# Patient Record
Sex: Female | Born: 1980 | Race: Black or African American | Hispanic: No | Marital: Single | Smoking: Current some day smoker
Health system: Southern US, Community
[De-identification: ages and names within clinical notes are randomized; demographics above are authoritative.]

## PROBLEM LIST (undated history)

## (undated) DIAGNOSIS — K5790 Diverticulosis of intestine, part unspecified, without perforation or abscess without bleeding: Secondary | ICD-10-CM

## (undated) DIAGNOSIS — F99 Mental disorder, not otherwise specified: Secondary | ICD-10-CM

## (undated) DIAGNOSIS — K589 Irritable bowel syndrome without diarrhea: Secondary | ICD-10-CM

## (undated) HISTORY — DX: Diverticulosis of intestine, part unspecified, without perforation or abscess without bleeding: K57.90

## (undated) HISTORY — DX: Irritable bowel syndrome, unspecified: K58.9

## (undated) HISTORY — PX: OTHER SURGICAL HISTORY: SHX169

## (undated) HISTORY — DX: Mental disorder, not otherwise specified: F99

---

## 2006-06-09 HISTORY — PX: CHOLECYSTECTOMY: SHX55

## 2006-08-13 ENCOUNTER — Emergency Department: Payer: Self-pay | Admitting: Emergency Medicine

## 2006-09-04 ENCOUNTER — Ambulatory Visit: Payer: Self-pay | Admitting: General Surgery

## 2020-01-25 ENCOUNTER — Other Ambulatory Visit: Payer: Self-pay

## 2020-01-25 ENCOUNTER — Ambulatory Visit (LOCAL_COMMUNITY_HEALTH_CENTER): Payer: Self-pay | Admitting: Advanced Practice Midwife

## 2020-01-25 ENCOUNTER — Encounter: Payer: Self-pay | Admitting: Advanced Practice Midwife

## 2020-01-25 VITALS — BP 130/82 | Ht 61.0 in | Wt 128.4 lb

## 2020-01-25 DIAGNOSIS — Z3009 Encounter for other general counseling and advice on contraception: Secondary | ICD-10-CM

## 2020-01-25 DIAGNOSIS — F1090 Alcohol use, unspecified, uncomplicated: Secondary | ICD-10-CM

## 2020-01-25 DIAGNOSIS — K580 Irritable bowel syndrome with diarrhea: Secondary | ICD-10-CM

## 2020-01-25 DIAGNOSIS — N760 Acute vaginitis: Secondary | ICD-10-CM

## 2020-01-25 DIAGNOSIS — K589 Irritable bowel syndrome without diarrhea: Secondary | ICD-10-CM | POA: Insufficient documentation

## 2020-01-25 DIAGNOSIS — Z7289 Other problems related to lifestyle: Secondary | ICD-10-CM

## 2020-01-25 DIAGNOSIS — Z789 Other specified health status: Secondary | ICD-10-CM

## 2020-01-25 DIAGNOSIS — B9689 Other specified bacterial agents as the cause of diseases classified elsewhere: Secondary | ICD-10-CM

## 2020-01-25 DIAGNOSIS — Z3046 Encounter for surveillance of implantable subdermal contraceptive: Secondary | ICD-10-CM

## 2020-01-25 LAB — WET PREP FOR TRICH, YEAST, CLUE
Trichomonas Exam: NEGATIVE
Yeast Exam: NEGATIVE

## 2020-01-25 NOTE — Progress Notes (Addendum)
Here today for PE. No PE or Pap records in Epic or [facility] systems. Has Nexplanon for current birth control. Per patient due to be changed 03/2021. Wants all STD screening today. PHQ 9 completed. Tawny Hopping, RN

## 2020-01-25 NOTE — Progress Notes (Signed)
[facility] DEPARTMENT [facility] [address] Main Number: [phone]    Family Planning Visit- Initial Visit  Subjective:  Bethany Stephens is a 39 y.o. engaged BF [phone] smoker and vaper   being seen today for an initial well woman visit and to discuss family planning options.  She is currently using Nexplanon for pregnancy prevention. Patient reports she does not want a pregnancy in the next year.  Patient has the following medical conditions does not have a problem list on file. IBS.  Pt with excessive eye makeup on, rapid speech, good eye contact  Chief Complaint  Patient presents with   Contraception    Physical Exam    Patient reports c/o increased white d/c with malodor x 1 week.  Fiance has been unfaithful.  Last cig 1 wek ago.  Last vaped 01/22/20.  Last MJ age 29.  Last ecstacy age 36.  Last ETOH last night (2 Coronas) daily.  LMP 12/2019?Marland Kitchen  Last sex 01/20/20 without condom.  Has Nexplanon placed 03/2018.  Living with her 2 daughters (53, 24) and just bought a house.  Her 36 yo son was shot and murdered in [place] 11/25/18 in a drive by and she hasn't received counseling.  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  PHQ-9=7 and pt states is having difficulty coping. Denies any formal psych dx, denies SI/HI or hx of.  Hx schizophrenia in m. Aunt and m. Uncle. Thinks last pap 2019?  Patient denies current drug use  Body mass index is 24.26 kg/m. - Patient is eligible for diabetes screening based on BMI and age >79?  not applicable HA1C ordered? not applicable  Patient reports 1 of partners in last year. Desires STI screening?  Yes  Has patient been screened once for HCV in the past?  No  No results found for: HCVAB  Does the patient have current drug use (including MJ), have a partner with drug use, and/or has been incarcerated since last result? No  If yes-- Screen  for HCV through [facility]   Does the patient meet criteria for HBV testing? No  Criteria:  -Household, sexual or needle sharing contact with HBV -History of drug use -HIV positive -Those with known Hep C   Health Maintenance Due  Topic Date Due   Hepatitis C Screening  Never done   COVID-19 Vaccine (1) Never done   HIV Screening  Never done   PAP SMEAR-Modifier  Never done   INFLUENZA VACCINE  01/08/2020    Review of Systems  Gastrointestinal: Positive for diarrhea (has IBS and diarrhea increasing).  All other systems reviewed and are negative.   The following portions of the patient's history were reviewed and updated as appropriate: allergies, current medications, past family history, past medical history, past social history, past surgical history and problem list. Problem list updated.   See flowsheet for other program required questions.  Objective:   Vitals:   01/25/20 1330  BP: 130/82  Weight: 128 lb 6.4 oz (58.2 kg)  Height: 5\' 1"  (1.549 m)    Physical Exam Constitutional:      Appearance: Normal appearance. She is normal weight.  HENT:     Head: Normocephalic and atraumatic.     Mouth/Throat:     Mouth: Mucous membranes are moist.  Eyes:     Conjunctiva/sclera: Conjunctivae normal.  Cardiovascular:  Rate and Rhythm: Normal rate and regular rhythm.  Pulmonary:     Effort: Pulmonary effort is normal.     Breath sounds: Normal breath sounds.  Chest:     Breasts:        Right: Normal.        Left: Normal.  Abdominal:     Palpations: Abdomen is soft.     Comments: Soft without masses or tenderness, good tone  Genitourinary:    General: Normal vulva.     Exam position: Lithotomy position.     Vagina: Vaginal discharge (grey creamy leukorrhea with malodor, ph>4.5) present.     Cervix: Normal.     Uterus: Normal.      Adnexa: Right adnexa normal and left adnexa normal.     Rectum: Normal.     Comments: Pap done Musculoskeletal:         General: Normal range of motion.     Cervical back: Normal range of motion and neck supple.  Skin:    General: Skin is warm and dry.  Neurological:     Mental Status: She is alert.  Psychiatric:        Mood and Affect: Mood normal.       Assessment and Plan:  [name] is a 39 y.o. female presenting to the [facility] Department for an initial well woman exam/family planning visit  Contraception counseling: Reviewed all forms of birth control options in the tiered based approach. available including abstinence; over the counter/barrier methods; hormonal contraceptive medication including pill, patch, ring, injection,contraceptive implant, ECP; hormonal and nonhormonal IUDs; permanent sterilization options including vasectomy and the various tubal sterilization modalities. Risks, benefits, and typical effectiveness rates were reviewed.  Questions were answered.  Written information was also given to the patient to review.  Patient desires continuation of Nexplanon, this was prescribed for patient. She will follow up in 1 year for surveillance.  She was told to call with any further questions, or with any concerns about this method of contraception.  Emphasized use of condoms 100% of the time for STI prevention.  Patient was offered ECP. ECP was not accepted by the patient. ECP counseling was not given - see RN documentation  1. Family planning Treat wet mount per standing orders Immunization nurse consult Please give pt Bethany Stephens contact info Please give pt primary care MD list Counsel on need for mammogram and colonoscopy due to IBS and family hx stage 4 colon cancer in m. aunt  - WET PREP FOR TRICH, YEAST, CLUE - Syphilis Serology, [facility] - HIV [facility] - Chlamydia/Gonorrhea [facility] - IGP, [facility]ptima HPV - Ambulatory referral to Behavioral Health  2. Encounter for surveillance of implantable subdermal contraceptive Due for removal 03/2021     No  follow-ups on file.  No future appointments.  Alberteen Spindle, CNM

## 2020-01-26 MED ORDER — METRONIDAZOLE 500 MG PO TABS: 500.0000 mg | ORAL_TABLET | Freq: Two times a day (BID) | ORAL | 0 refills | Status: AC

## 2020-01-26 NOTE — Addendum Note (Signed)
Addended by: Tracey Harries on: 01/26/2020 02:53 PM   Modules accepted: Orders

## 2020-01-26 NOTE — Progress Notes (Signed)
Wet mount reviewed by provider; per verbal order by Elizabeth Sciora, CNM, pt treated for BV per standing order. Provider orders completed. 

## 2020-01-30 LAB — IGP, APTIMA HPV
HPV Aptima: NEGATIVE
PAP Smear Comment: 0

## 2020-02-01 ENCOUNTER — Telehealth: Payer: Self-pay | Admitting: Family Medicine

## 2020-02-01 NOTE — Telephone Encounter (Signed)
Wants test results

## 2020-02-02 NOTE — Telephone Encounter (Signed)
Phone call to pt. Pt states she was seen on the 18th last week and wants TR. Pt was not able to verify a password from her visit. Pt declined TR appt. Pt counseled that it could take up to 3 weeks to get all TR, we do not call with negative results, we will call if anything is positive or if there is a problem with testing. Sent My Chart information for sign up to cell phone on file (and confirmed by pt) per pt request.

## 2020-08-17 ENCOUNTER — Emergency Department: Payer: Self-pay

## 2020-08-17 ENCOUNTER — Emergency Department
Admission: EM | Admit: 2020-08-17 | Discharge: 2020-08-17 | Disposition: A | Discharge status: Home / Self Care | Payer: Self-pay | Attending: Emergency Medicine | Admitting: Emergency Medicine

## 2020-08-17 ENCOUNTER — Other Ambulatory Visit: Payer: Self-pay

## 2020-08-17 DIAGNOSIS — R1030 Lower abdominal pain, unspecified: Secondary | ICD-10-CM | POA: Insufficient documentation

## 2020-08-17 DIAGNOSIS — R300 Dysuria: Secondary | ICD-10-CM | POA: Insufficient documentation

## 2020-08-17 DIAGNOSIS — F17210 Nicotine dependence, cigarettes, uncomplicated: Secondary | ICD-10-CM | POA: Insufficient documentation

## 2020-08-17 DIAGNOSIS — F17290 Nicotine dependence, other tobacco product, uncomplicated: Secondary | ICD-10-CM | POA: Insufficient documentation

## 2020-08-17 DIAGNOSIS — R339 Retention of urine, unspecified: Secondary | ICD-10-CM | POA: Insufficient documentation

## 2020-08-17 LAB — CBC WITH DIFFERENTIAL/PLATELET
Abs Immature Granulocytes: 0.36 10*3/uL — ABNORMAL HIGH (ref 0.00–0.07)
Basophils Absolute: 0.1 10*3/uL (ref 0.0–0.1)
Basophils Relative: 0 %
Eosinophils Absolute: 0 10*3/uL (ref 0.0–0.5)
Eosinophils Relative: 0 %
HCT: 30.9 % — ABNORMAL LOW (ref 36.0–46.0)
Hemoglobin: 9.9 g/dL — ABNORMAL LOW (ref 12.0–15.0)
Immature Granulocytes: 2 %
Lymphocytes Relative: 17 %
Lymphs Abs: 2.8 10*3/uL (ref 0.7–4.0)
MCH: 29.9 pg (ref 26.0–34.0)
MCHC: 32 g/dL (ref 30.0–36.0)
MCV: 93.4 fL (ref 80.0–100.0)
Monocytes Absolute: 1.1 10*3/uL — ABNORMAL HIGH (ref 0.1–1.0)
Monocytes Relative: 6 %
Neutro Abs: 12.5 10*3/uL — ABNORMAL HIGH (ref 1.7–7.7)
Neutrophils Relative %: 75 %
Platelets: 518 10*3/uL — ABNORMAL HIGH (ref 150–400)
RBC: 3.31 MIL/uL — ABNORMAL LOW (ref 3.87–5.11)
RDW: 14.2 % (ref 11.5–15.5)
WBC: 16.8 10*3/uL — ABNORMAL HIGH (ref 4.0–10.5)
nRBC: 0.2 % (ref 0.0–0.2)

## 2020-08-17 LAB — POC URINE PREG, ED: Preg Test, Ur: NEGATIVE

## 2020-08-17 LAB — URINALYSIS, COMPLETE (UACMP) WITH MICROSCOPIC
Bacteria, UA: NONE SEEN
Bilirubin Urine: NEGATIVE
Glucose, UA: NEGATIVE mg/dL
Ketones, ur: NEGATIVE mg/dL
Leukocytes,Ua: NEGATIVE
Nitrite: NEGATIVE
Protein, ur: NEGATIVE mg/dL
Specific Gravity, Urine: 1.005 (ref 1.005–1.030)
Squamous Epithelial / HPF: NONE SEEN (ref 0–5)
pH: 6 (ref 5.0–8.0)

## 2020-08-17 LAB — COMPREHENSIVE METABOLIC PANEL
ALT: 24 U/L (ref 0–44)
AST: 27 U/L (ref 15–41)
Albumin: 3.5 g/dL (ref 3.5–5.0)
Alkaline Phosphatase: 35 U/L — ABNORMAL LOW (ref 38–126)
Anion gap: 15 (ref 5–15)
BUN: 9 mg/dL (ref 6–20)
CO2: 18 mmol/L — ABNORMAL LOW (ref 22–32)
Calcium: 8.6 mg/dL — ABNORMAL LOW (ref 8.9–10.3)
Chloride: 104 mmol/L (ref 98–111)
Creatinine, Ser: 0.56 mg/dL (ref 0.44–1.00)
GFR, Estimated: >60 mL/min (ref >60)
Glucose, Bld: 87 mg/dL (ref 70–99)
Potassium: 3.9 mmol/L (ref 3.5–5.1)
Sodium: 137 mmol/L (ref 135–145)
Total Bilirubin: 0.9 mg/dL (ref 0.3–1.2)
Total Protein: 7.1 g/dL (ref 6.5–8.1)

## 2020-08-17 LAB — PREGNANCY, URINE: Preg Test, Ur: NEGATIVE

## 2020-08-17 IMAGING — CT CT ABD-PELV W/ CM
2 of 5 series · 15 of 46 positions shown, 17 images · IV contrast (APPLIED)
Comparison: None.

CLINICAL DATA: Urinary retention status post liposuction and
gluteal lift.

EXAM:
CT ABDOMEN AND PELVIS WITH CONTRAST
TECHNIQUE: Multidetector CT imaging of the abdomen and pelvis was performed
using the standard protocol following bolus administration of
intravenous contrast.
CONTRAST:  100mL OMNIPAQUE IOHEXOL 300 MG/ML  SOLN

[Series 2: routine abd/pel with · axial · 0.68mm/px · z∈[-676,-306]mm · 12 of 84 slices shown, 14 images]
[im 5/84  soft-tissue]
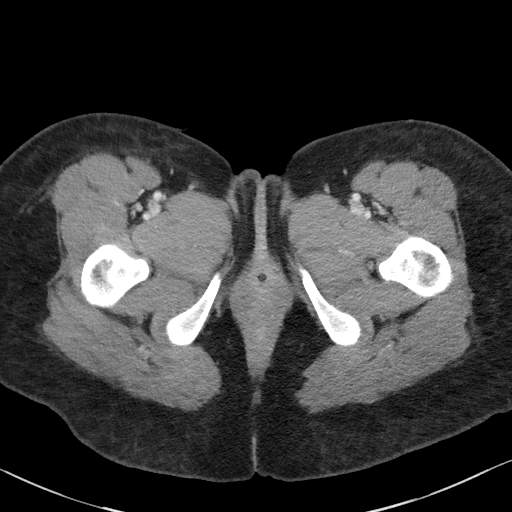
[im 5/84  bone]
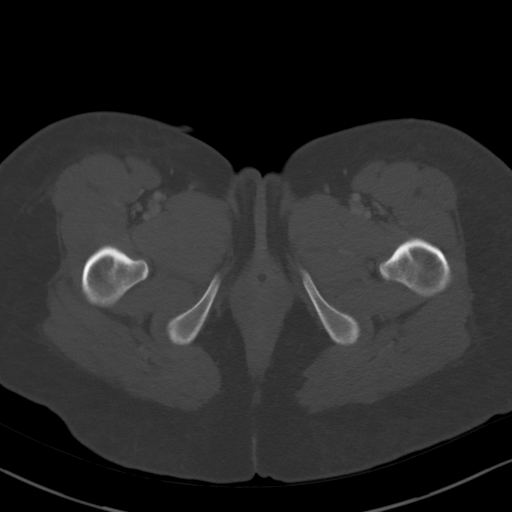
[im 14/84  soft-tissue]
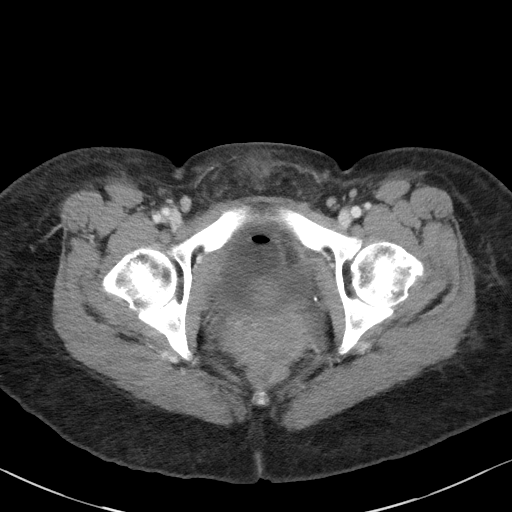
[im 19/84  soft-tissue]
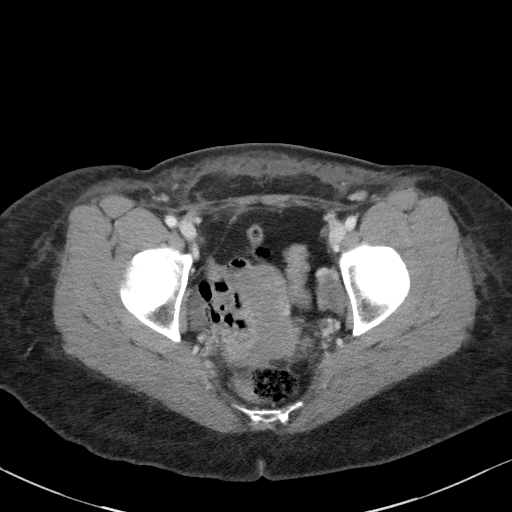
[im 24/84  soft-tissue]
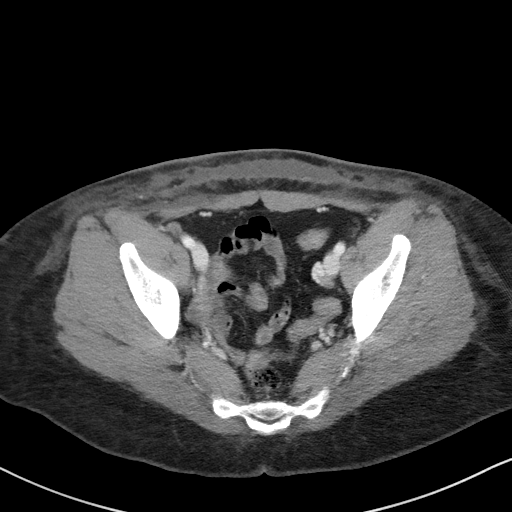
[im 33/84  soft-tissue]
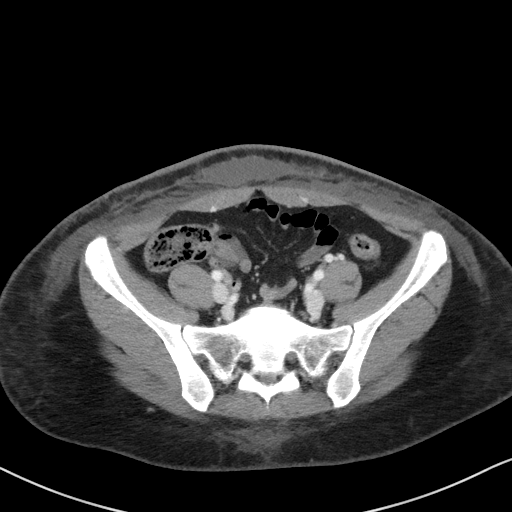
[im 37/84  soft-tissue]
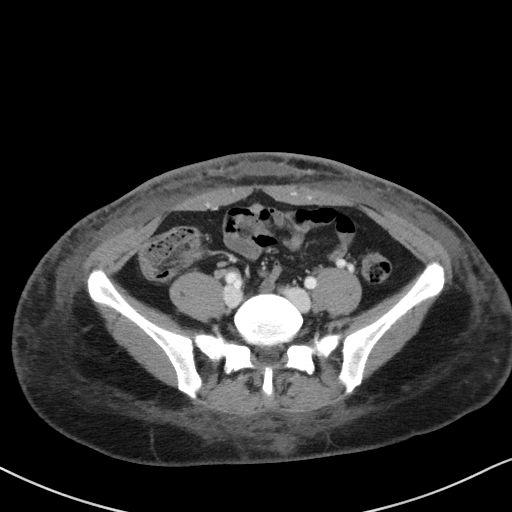
[im 47/84  soft-tissue]
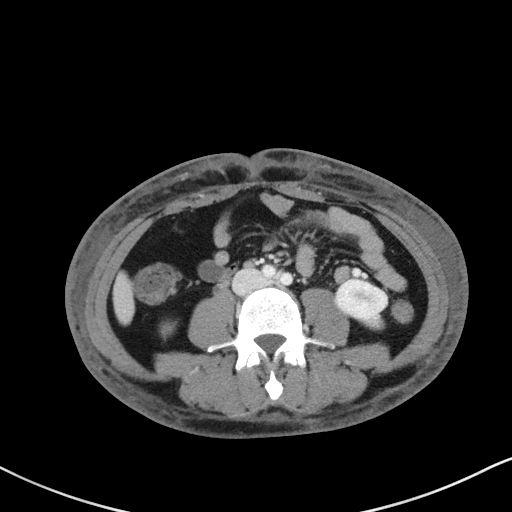
[im 51/84  soft-tissue]
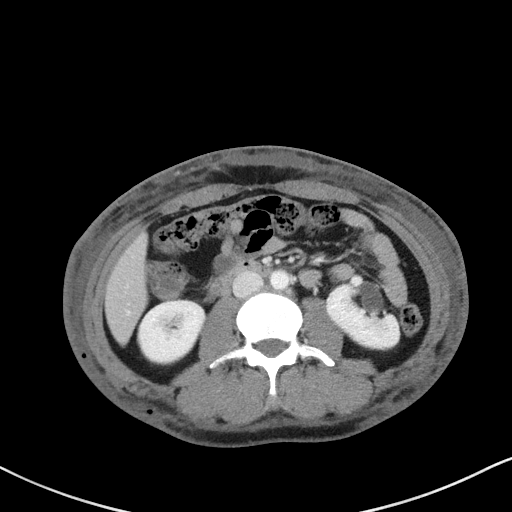
[im 60/84  soft-tissue]
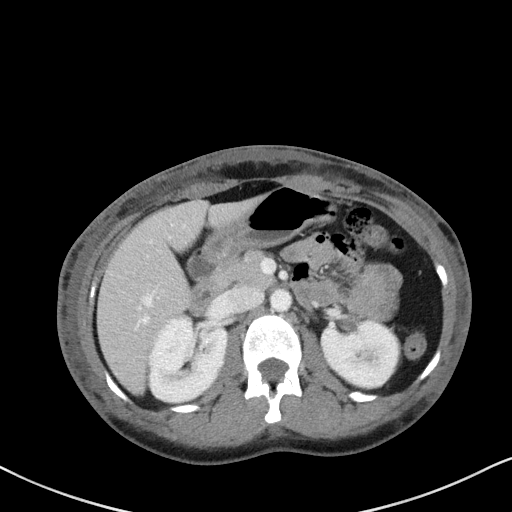
[im 60/84  bone]
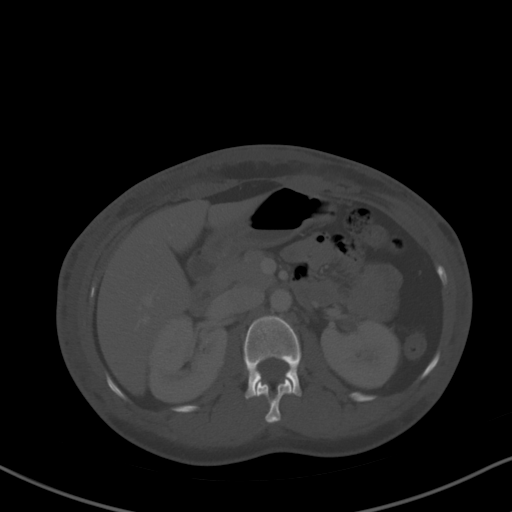
[im 65/84  soft-tissue]
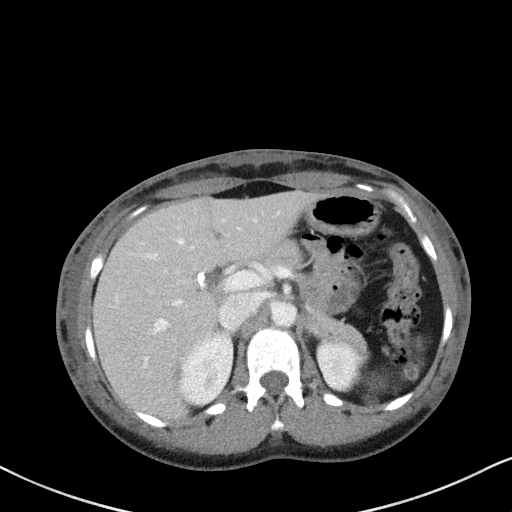
[im 70/84  soft-tissue]
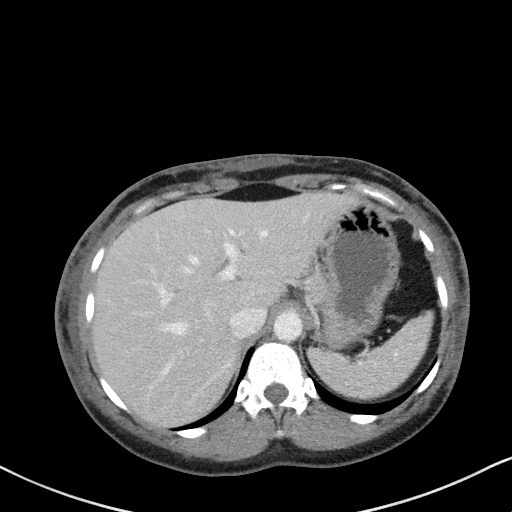
[im 79/84  soft-tissue]
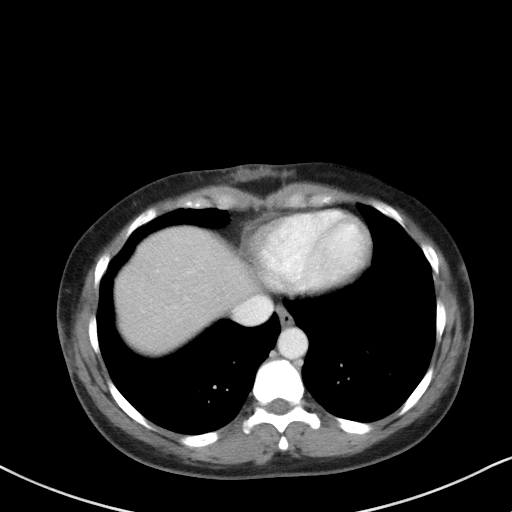

[Series 5: coronal st · coronal · 0.64mm/px · 3 of 88 slices shown]
[im 30/88  soft-tissue]
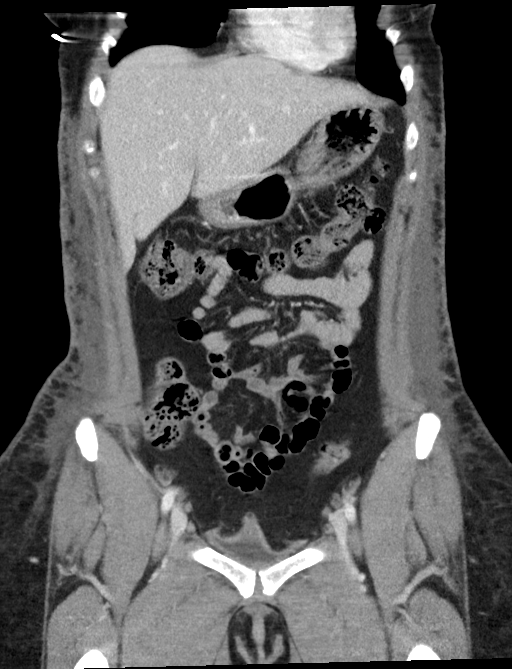
[im 39/88  soft-tissue]
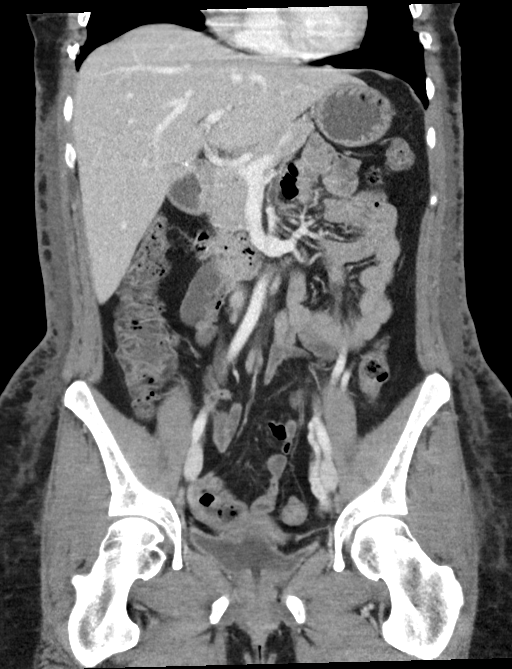
[im 49/88  soft-tissue]
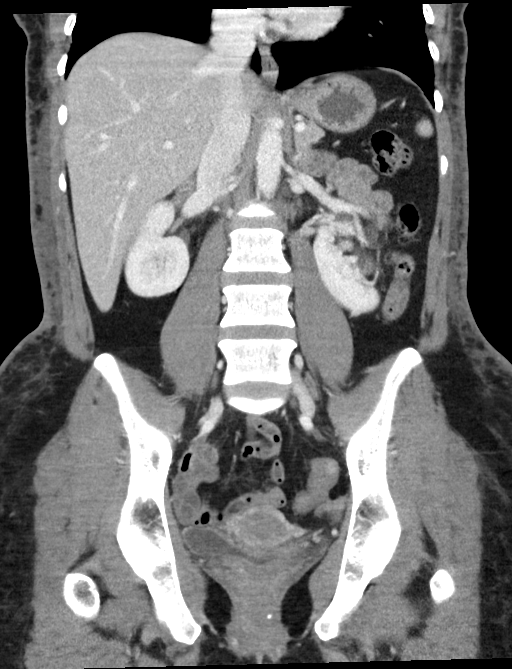

[15 of 46 positions shown; findings below may reference images not displayed]

FINDINGS: Lower chest: The lung bases are clear. The heart size is normal.

Hepatobiliary: The liver is normal. Status post
cholecystectomy.There is no biliary ductal dilation.

Pancreas: Normal contours without ductal dilatation. No
peripancreatic fluid collection.

Spleen: There are tiny benign-appearing cystic nodules involving the
spleen.

Adrenals/Urinary Tract:

--Adrenal glands: Unremarkable.

--Right kidney/ureter: No hydronephrosis or radiopaque kidney
stones.

--Left kidney/ureter: Left kidney is malrotated without evidence for
hydronephrosis.

--Urinary bladder: The urinary bladder is decompressed by Foley
catheter.

Stomach/Bowel:

--Stomach/Duodenum: No hiatal hernia or other gastric abnormality.
Normal duodenal course and caliber.

--Small bowel: Unremarkable.

--Colon: Unremarkable.

--Appendix: Normal.

Vascular/Lymphatic: Normal course and caliber of the major abdominal
vessels.

--No retroperitoneal lymphadenopathy.

--No mesenteric lymphadenopathy.

--No pelvic or inguinal lymphadenopathy.

Reproductive: Unremarkable

Other: There is extensive subcutaneous fat stranding and fluid
tracking through the patient's anterior abdominal wall. There is
overlying skin thickening. Findings are felt to be secondary to the
patient's reported liposuction. The fluid collections do not appear
to be rim enhancing and are favored to represent postoperative
seromas or hematomas. There is fat stranding in the gluteal region
bilaterally favored to be secondary to the reported gluteal lift.
Again, no concerning collection to suggest the presence of an
abscess.

Musculoskeletal. No acute displaced fractures.
IMPRESSION: 1. Extensive subcutaneous fat stranding and fluid tracking through
the patient's anterior abdominal wall. Findings are felt to be
secondary to the patient's reported liposuction. The fluid
collections do not appear to be rim enhancing and are favored to
represent postoperative seromas or hematomas.
2. Fat stranding in the gluteal region bilaterally favored to be
secondary to the patient's reported gluteal lift. Again, no
concerning collection to suggest the presence of an abscess.
3. The urinary bladder is decompressed by Foley catheter.

## 2020-08-17 MED ORDER — IOHEXOL 300 MG/ML  SOLN
100.0000 mL | Freq: Once | INTRAMUSCULAR | Status: AC | PRN
  Administered 2020-08-17: 100 mL via INTRAVENOUS
  Filled 2020-08-17: qty 100

## 2020-08-17 MED ORDER — LACTATED RINGERS IV BOLUS
1000.0000 mL | Freq: Once | INTRAVENOUS | Status: AC
  Administered 2020-08-17: 1000 mL via INTRAVENOUS

## 2020-08-17 MED ORDER — KETOROLAC TROMETHAMINE 30 MG/ML IJ SOLN
30.0000 mg | Freq: Once | INTRAMUSCULAR | Status: AC
  Administered 2020-08-17: 30 mg via INTRAVENOUS
  Filled 2020-08-17: qty 1

## 2020-08-17 MED ORDER — HYDROXYZINE HCL 25 MG PO TABS: 25.0000 mg | ORAL_TABLET | Freq: Four times a day (QID) | ORAL | 0 refills | Status: AC | PRN

## 2020-08-17 MED ORDER — IBUPROFEN 600 MG PO TABS
600.0000 mg | ORAL_TABLET | Freq: Four times a day (QID) | ORAL | 0 refills | Status: DC | PRN
Start: 2020-08-17 — End: 2022-09-19

## 2020-08-17 MED ORDER — SODIUM CHLORIDE 0.9 % IV SOLN
1.0000 g | Freq: Once | INTRAVENOUS | Status: AC
  Administered 2020-08-17: 1 g via INTRAVENOUS
  Filled 2020-08-17: qty 10

## 2020-08-17 MED ORDER — FAMOTIDINE 40 MG PO TABS: 40.0000 mg | ORAL_TABLET | Freq: Every day | ORAL | 0 refills | Status: AC

## 2020-08-17 MED ORDER — DIPHENHYDRAMINE HCL 50 MG/ML IJ SOLN
25.0000 mg | Freq: Once | INTRAMUSCULAR | Status: AC
  Administered 2020-08-17: 25 mg via INTRAVENOUS
  Filled 2020-08-17: qty 1

## 2020-08-17 NOTE — ED Notes (Signed)
~  sterile NS instilled in urinary catheter prior to DC. Foley removed, and patient immediately had urge to void. Attempted to void in bathroom, unsuccessful. MD notified. Patient states she'd like to try again in a few minutes.

## 2020-08-17 NOTE — ED Triage Notes (Addendum)
Pt to ER via [facility] with issues with urination since last night. Pt reports hip/ buttock surgery last week. Starting last night pt has been unable to urinate, reports very little urine output, denies burning or foul odor. Pt in obvious pain in triage. Able to void very little in bathroom.

## 2020-08-17 NOTE — Discharge Instructions (Addendum)
Make sure you have regular bowel movements while taking the pain medication.  You can take over-the-counter stool softener such as Colace or MiraLAX as needed for this.  Drink at least 6 to 8 glasses of water daily.  Try to avoid taking Benadryl for itching.  You can take the prescribed medication.  Only take this as needed, as all of these medications can lead to retention.  It is okay to take ibuprofen.  I prescribed this.  I have also prescribed Pepcid which is an antihistamine that can also be purchased over-the-counter and can help with itching.

## 2020-08-17 NOTE — ED Notes (Signed)
Lab called to add on urine culture to initial UA sent with first cath this afternoon.

## 2020-08-17 NOTE — ED Notes (Signed)
Unable to void second attempt. MD notified.

## 2020-08-17 NOTE — ED Provider Notes (Signed)
[facility] Emergency Department Provider Note  ____________________________________________   Event Date/Time   First MD Initiated Contact with Patient 08/17/20 1305     (approximate)  I have reviewed the triage vital signs and the nursing notes.   HISTORY  Chief Complaint Urinary Retention    HPI Bethany Stephens is a 40 y.o. female here with urinary retention after recent liposuction and gluteal lift.  The patient states that approximately 1.5 weeks ago, she had liposuction and gluteal tuck at a hospital in [place].  She states that she had been recovering fairly well, until the last 24 hours or so.  The patient states that over the last 24 hours, she has felt increasing difficulty urinating.  She is had associated lower abdominal fullness.  She feels like she is been trying to go to the bathroom but has not been able to.  She believes that she has been having bowel movements regularly.  She does admit that she has been taking fairly large levels of Tylenol as well as oxycodone for her pain, and she has had diffuse pruritus related to her analgesia.  She believes that she has been taking Benadryl fairly regularly for this as well.  Denies any fevers.  Denies any drainage from her operative sites.  No other complaints.  No specific alleviating or aggravating factors.        Past Medical History:  Diagnosis Date  . Mental disorder     Patient Active Problem List   Diagnosis Date Noted  . IBS (irritable bowel syndrome) 01/25/2020  . Alcohol use dialy 01/25/2020    Past Surgical History:  Procedure Laterality Date  . CHOLECYSTECTOMY  2008  . hip surgery      Prior to Admission medications   Medication Sig Start Date End Date Taking? Authorizing Provider  famotidine (PEPCID) 40 MG tablet Take 1 tablet (40 mg total) by mouth daily for 7 days. 08/17/20 08/24/20 Yes Shaune Pollack, MD  hydrOXYzine (ATARAX/VISTARIL) 25 MG tablet Take 1 tablet (25 mg total)  by mouth every 6 (six) hours as needed for itching. 08/17/20  Yes Shaune Pollack, MD  ibuprofen (ADVIL) 600 MG tablet Take 1 tablet (600 mg total) by mouth every 6 (six) hours as needed for moderate pain. 08/17/20  Yes Shaune Pollack, MD    Allergies Patient has no known allergies.  Family History  Problem Relation Age of Onset  . Hypertension Maternal Grandmother   . Heart disease Maternal Grandmother   . Diabetes Maternal Grandmother   . Liver cancer Father   . Hypertension Mother   . Diabetes Mother   . Sarcoidosis Cousin   . Kidney disease Maternal Uncle   . Colon cancer Maternal Aunt   . Depression Maternal Aunt   . Schizophrenia Maternal Aunt   . Bipolar disorder Maternal Aunt   . Depression Daughter     Social History Social History   Tobacco Use  . Smoking status: Current Some Day Smoker    Types: Cigarettes, E-cigarettes  . Smokeless tobacco: Current User  . Tobacco comment: Ocassional Vapes  Substance Use Topics  . Alcohol use: Yes    Alcohol/week: 1.0 standard drink    Types: 1 Glasses of wine per week    Comment: daily  . Drug use: Not Currently    Types: Marijuana, MDMA (Ecstacy)    Review of Systems  Review of Systems  Constitutional: Positive for fatigue. Negative for fever.  HENT: Negative for congestion and sore throat.  Eyes: Negative for visual disturbance.  Respiratory: Negative for cough and shortness of breath.   Cardiovascular: Negative for chest pain.  Gastrointestinal: Negative for abdominal pain, diarrhea, nausea and vomiting.  Genitourinary: Positive for decreased urine volume. Negative for flank pain.  Musculoskeletal: Negative for back pain and neck pain.  Skin: Negative for rash and wound.  Neurological: Positive for weakness.  All other systems reviewed and are negative.    ____________________________________________  PHYSICAL EXAM:      VITAL SIGNS: ED Triage Vitals  Enc Vitals Group     BP      Pulse      Resp       Temp      Temp src      SpO2      Weight      Height      Head Circumference      Peak Flow      Pain Score      Pain Loc      Pain Edu?      Excl. in GC?      Physical Exam Vitals and nursing note reviewed.  Constitutional:      General: She is not in acute distress.    Appearance: She is well-developed.  HENT:     Head: Normocephalic and atraumatic.  Eyes:     Conjunctiva/sclera: Conjunctivae normal.  Cardiovascular:     Rate and Rhythm: Normal rate and regular rhythm.     Heart sounds: Normal heart sounds.  Pulmonary:     Effort: Pulmonary effort is normal. No respiratory distress.     Breath sounds: No wheezing.  Abdominal:     General: Abdomen is flat. There is no distension.     Tenderness: There is abdominal tenderness (Moderate suprapubic tenderness). There is no guarding or rebound.  Musculoskeletal:     Cervical back: Neck supple.  Skin:    General: Skin is warm.     Capillary Refill: Capillary refill takes less than 2 seconds.     Findings: No rash.  Neurological:     Mental Status: She is alert and oriented to person, place, and time.     Motor: No abnormal muscle tone.       ____________________________________________   LABS (all labs ordered are listed, but only abnormal results are displayed)  Labs Reviewed  URINALYSIS, COMPLETE (UACMP) WITH MICROSCOPIC - Abnormal; Notable for the following components:      Result Value   Color, Urine STRAW (*)    APPearance CLEAR (*)    Hgb urine dipstick SMALL (*)    All other components within normal limits  CBC WITH DIFFERENTIAL/PLATELET - Abnormal; Notable for the following components:   WBC 16.8 (*)    RBC 3.31 (*)    Hemoglobin 9.9 (*)    HCT 30.9 (*)    Platelets 518 (*)    Neutro Abs 12.5 (*)    Monocytes Absolute 1.1 (*)    Abs Immature Granulocytes 0.36 (*)    All other components within normal limits  COMPREHENSIVE METABOLIC PANEL - Abnormal; Notable for the following components:   CO2 18  (*)    Calcium 8.6 (*)    Alkaline Phosphatase 35 (*)    All other components within normal limits  URINE CULTURE  PREGNANCY, URINE  POC URINE PREG, ED    ____________________________________________  EKG:  ________________________________________  RADIOLOGY All imaging, including plain films, CT scans, and ultrasounds, independently reviewed by me, and interpretations confirmed via  formal radiology reads.  ED MD interpretation:   CT abdomen/pelvis: Extensive subcutaneous fat stranding consistent with postoperative inflammation, no apparent abscess, bladder decompressed, no hydro-  Official radiology report(s): CT ABDOMEN PELVIS W CONTRAST  Result Date: 08/17/2020 CLINICAL DATA:  Urinary retention status post liposuction and gluteal lift. EXAM: CT ABDOMEN AND PELVIS WITH CONTRAST TECHNIQUE: Multidetector CT imaging of the abdomen and pelvis was performed using the standard protocol following bolus administration of intravenous contrast. CONTRAST:  OMNIPAQUE IOHEXOL 300 MG/ML  SOLN COMPARISON:  None. FINDINGS: Lower chest: The lung bases are clear. The heart size is normal. Hepatobiliary: The liver is normal. Status post cholecystectomy.There is no biliary ductal dilation. Pancreas: Normal contours without ductal dilatation. No peripancreatic fluid collection. Spleen: There are tiny benign-appearing cystic nodules involving the spleen. Adrenals/Urinary Tract: --Adrenal glands: Unremarkable. --Right kidney/ureter: No hydronephrosis or radiopaque kidney stones. 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 --Urinary bladder: The urinary bladder is decompressed by Foley catheter. Stomach/Bowel: --Stomach/Duodenum: No hiatal hernia or other gastric abnormality. Normal duodenal course and caliber. --Small bowel: Unremarkable. --Colon: Unremarkable. --Appendix: Normal. Vascular/Lymphatic: Normal course and caliber of the major abdominal vessels. --No  retroperitoneal lymphadenopathy. --No mesenteric lymphadenopathy. --No pelvic or inguinal lymphadenopathy. Reproductive: Unremarkable Other: There is extensive subcutaneous fat stranding and fluid tracking through the patient's anterior abdominal wall. There is overlying skin thickening. Findings are felt to be secondary to the patient's reported liposuction. The fluid collections do not appear to be rim enhancing and are favored to represent postoperative seromas or hematomas. There is fat stranding in the gluteal region bilaterally favored to be secondary to the reported gluteal lift. Again, no concerning collection to suggest the presence of an abscess. Musculoskeletal. No acute displaced fractures. IMPRESSION: 1. Extensive subcutaneous fat stranding and fluid tracking through the patient's anterior abdominal wall. Findings are felt to be secondary to the patient's reported liposuction. The fluid collections do not appear to be rim enhancing and are favored to represent postoperative seromas or hematomas. 2. Fat stranding in the gluteal region bilaterally favored to be secondary to the patient's reported gluteal lift. Again, no concerning collection to suggest the presence of an abscess. 3. The urinary bladder is decompressed by Foley catheter. Electronically Signed   By: Katherine Mantle M.D.   On: 08/17/2020 15:34    ____________________________________________  PROCEDURES   Procedure(s) performed (including Critical Care):  Procedures  ____________________________________________  INITIAL IMPRESSION / MDM / ASSESSMENT AND PLAN / ED COURSE  As part of my medical decision making, I reviewed the following data within the electronic MEDICAL RECORD NUMBER Nursing notes reviewed and incorporated, Old chart reviewed, Notes from prior ED visits, and [place] Controlled Substance Database       *Ahliyah Nienow was evaluated in Emergency Department on 08/17/2020 for the symptoms described in the history of  present illness. She was evaluated in the context of the global COVID-19 pandemic, which necessitated consideration that the patient might be at risk for infection with the SARS-CoV-2 virus that causes COVID-19. Institutional protocols and algorithms that pertain to the evaluation of patients at risk for COVID-19 are in a state of rapid change based on information released by regulatory bodies including the CDC and federal and state organizations. These policies and algorithms were followed during the patient's care in the ED.  Some ED evaluations and interventions may be delayed as a result of limited staffing during the pandemic.*     Medical Decision Making: Very pleasant 40 year old female here with urinary  retention after recent gluteal augmentation and liposuction.  Suspect combination of postoperative urinary retention secondary to anesthesia, possible antihistamine use in the setting of itching from her opiates, and possible fluid shifts related to her liposuction.  She has a moderate leukocytosis here, which does appear to have a shift, though she has had no fever and her operative sites appear completely clean, dry, without any erythema or tenderness.  CT scan obtained shows expected postoperative changes without any apparent abscess, intraperitoneal complications, or other abnormalities.  Her lab work is otherwise reassuring.  She is mildly tachycardic but states this is chronic, and has also been given IV fluids here.  Again I suspect this is expected in the setting of her significant liposuction.  She has no signs of fat necrosis or other complications on exam.  Patient was initially drained with a Foley catheter.  This was removed and patient remained unable to void.  I discussed the case with Dr. Vanna Scotland of urology.  Will replace a Foley, have her follow-up in clinic.  Advised her to avoid antihistamines, though she does continue to need narcotics for her pain.  Will have her switch from  high-dose Benadryl to Atarax and try Pepcid as well.  Encourage fluids.  Return precautions given.  Of note, given her leukocytosis, though her urinalysis is unremarkable, she was given a periprocedural dose of Rocephin during the catheter replacement, in the event of possible occult UTI.  Given absence of any pyuria on UA, do not feel outpatient antibiotics would be beneficial at this time.  ____________________________________________  FINAL CLINICAL IMPRESSION(S) / ED DIAGNOSES  Final diagnoses:  Urinary retention     MEDICATIONS GIVEN DURING THIS VISIT:  Medications  cefTRIAXone (ROCEPHIN) 1 g in sodium chloride 0.9 % 100 mL IVPB (has no administration in time range)  lactated ringers bolus 1,000 mL (0 mLs Intravenous Stopped 08/17/20 1633)  diphenhydrAMINE (BENADRYL) injection 25 mg (25 mg Intravenous Given 08/17/20 1407)  ketorolac (TORADOL) 30 MG/ML injection 30 mg (30 mg Intravenous Given 08/17/20 1449)  iohexol (OMNIPAQUE) 300 MG/ML solution 100 mL (100 mLs Intravenous Contrast Given 08/17/20 1501)  lactated ringers bolus 1,000 mL (1,000 mLs Intravenous New Bag/Given 08/17/20 1633)     ED Discharge Orders         Ordered    ibuprofen (ADVIL) 600 MG tablet  Every 6 hours PRN        08/17/20 1619    hydrOXYzine (ATARAX/VISTARIL) 25 MG tablet  Every 6 hours PRN        08/17/20 1619    famotidine (PEPCID) 40 MG tablet  Daily        08/17/20 1619           Note:  This document was prepared using Dragon voice recognition software and may include unintentional dictation errors.   Shaune Pollack, MD 08/17/20 [phone]

## 2020-08-19 LAB — URINE CULTURE: Culture: <10000 — AB

## 2021-08-09 ENCOUNTER — Encounter: Payer: Self-pay | Admitting: Family Medicine

## 2021-08-09 ENCOUNTER — Other Ambulatory Visit: Payer: Self-pay

## 2021-08-09 ENCOUNTER — Ambulatory Visit (LOCAL_COMMUNITY_HEALTH_CENTER): Payer: Self-pay | Admitting: Nurse Practitioner

## 2021-08-09 VITALS — BP 112/79 | Ht 61.0 in | Wt 126.0 lb

## 2021-08-09 DIAGNOSIS — Z01419 Encounter for gynecological examination (general) (routine) without abnormal findings: Secondary | ICD-10-CM

## 2021-08-09 DIAGNOSIS — Z3009 Encounter for other general counseling and advice on contraception: Secondary | ICD-10-CM

## 2021-08-09 DIAGNOSIS — F32A Depression, unspecified: Secondary | ICD-10-CM

## 2021-08-09 LAB — WET PREP FOR TRICH, YEAST, CLUE
Trichomonas Exam: NEGATIVE
Yeast Exam: NEGATIVE

## 2021-08-09 NOTE — Progress Notes (Signed)
[facility] DEPARTMENT ?[facility] ?[address] [employer] ?Main Number: [phone] ? ? ? ?Family Planning Visit- Initial Visit ? ?Subjective:  ?Bethany Stephens is a 41 y.o.  [phone]   being seen today for an initial annual visit and to discuss reproductive life planning.  The patient is currently using Hormonal Implant for pregnancy prevention. Patient reports   does not want a pregnancy in the next year.  Patient has the following medical conditions has IBS (irritable bowel syndrome) and Alcohol use dialy on their problem list. ? ?Chief Complaint  ?Patient presents with  ? Annual Exam  ? Contraception  ? ? ?Patient reports to today for a physical and STD screening  ? ?Patient denies current signs and symptoms   ? ?Body mass index is 23.81 kg/m?. - Patient is eligible for diabetes screening based on BMI and age >29?  yes ?HA1C ordered? yes ? ?Patient reports 2  partner/s in last year. Desires STI screening?  Yes ? ?Has patient been screened once for HCV in the past?  No ? No results found for: HCVAB ? ?Does the patient have current drug use (including MJ), have a partner with drug use, and/or has been incarcerated since last result? No  ?If yes-- Screen for HCV through [facility] ?  ?Does the patient meet criteria for HBV testing? No ? ?Criteria:  ?-Household, sexual or needle sharing contact with HBV ?-History of drug use ?-HIV positive ?-Those with known Hep C ? ? ?Health Maintenance Due  ?Topic Date Due  ? COVID-19 Vaccine (1) Never done  ? HIV Screening  Never done  ? Hepatitis C Screening  Never done  ? INFLUENZA VACCINE  01/07/2021  ? ? ?Review of Systems  ?Constitutional:  Negative for chills, fever, malaise/fatigue and weight loss.  ?HENT:  Negative for congestion, hearing loss and sore throat.   ?Eyes:  Negative for blurred vision, double vision and photophobia.  ?Respiratory:  Negative for shortness of breath.   ?Cardiovascular:  Negative for chest pain.  ?Gastrointestinal:   Negative for abdominal pain, blood in stool, constipation, diarrhea, heartburn, nausea and vomiting.  ?Genitourinary:  Negative for dysuria and frequency.  ?Musculoskeletal:  Negative for back pain, joint pain and neck pain.  ?Skin:  Negative for itching and rash.  ?Neurological:  Negative for dizziness, weakness and headaches.  ?Endo/Heme/Allergies:  Does not bruise/bleed easily.  ?Psychiatric/Behavioral:  Negative for depression, substance abuse and suicidal ideas.   ? ?The following portions of the patient's history were reviewed and updated as appropriate: allergies, current medications, past family history, past medical history, past social history, past surgical history and problem list. Problem list updated. ? ? ?See flowsheet for other program required questions. ? ?Objective:  ? ?Vitals:  ? 08/09/21 0939  ?BP: 112/79  ?Weight: 126 lb (57.2 kg)  ?Height: 5\' 1"  (1.549 m)  ? ? ?Physical Exam ?Constitutional:   ?   Appearance: Normal appearance.  ?HENT:  ?   Head: Normocephalic.  ?   Right Ear: External ear normal.  ?   Left Ear: External ear normal.  ?   Nose: Nose normal.  ?   Mouth/Throat:  ?   Mouth: Mucous membranes are moist.  ?   Comments: No visible signs of dental caries.   ?Eyes:  ?   Pupils: Pupils are equal, round, and reactive to light.  ?Cardiovascular:  ?   Rate and Rhythm: Normal rate and regular rhythm.  ?Pulmonary:  ?   Effort:  Pulmonary effort is normal.  ?   Breath sounds: Normal breath sounds.  ?Chest:  ?   Comments: Breasts:  ?      Right: Normal. No swelling, mass, nipple discharge, skin change or tenderness.  ?      Left: Normal. No swelling, mass, nipple discharge, skin change or tenderness.   ?Abdominal:  ?   General: Abdomen is flat. Bowel sounds are normal.  ?   Palpations: Abdomen is soft.  ?Genitourinary: ?   Comments: External genitalia/pubic area without nits, lice, edema, erythema, lesions and inguinal adenopathy. ?Vagina with normal mucosa and discharge. ?Cervix without visible  lesions. ?Uterus firm, mobile, nt, no masses, no CMT, no adnexal tenderness or fullness. pH 4.5. ?Musculoskeletal:  ?   Cervical back: Full passive range of motion without pain, normal range of motion and neck supple.  ?Skin: ?   General: Skin is warm and dry.  ?Neurological:  ?   Mental Status: She is alert and oriented to person, place, and time.  ?Psychiatric:     ?   Attention and Perception: Attention normal.     ?   Mood and Affect: Mood normal.     ?   Speech: Speech normal.     ?   Behavior: Behavior is cooperative.  ? ? ? ? ?Assessment and Plan:  ?Bethany Stephens is a 41 y.o. female presenting to the [facility] Department for an initial annual wellness/contraceptive visit ? ?Contraception counseling: Reviewed all forms of birth control options in the tiered based approach. available including abstinence; over the counter/barrier methods; hormonal contraceptive medication including pill, patch, ring, injection,contraceptive implant, ECP; hormonal and nonhormonal IUDs; permanent sterilization options including vasectomy and the various tubal sterilization modalities. Risks, benefits, and typical effectiveness rates were reviewed.  Questions were answered.  Written information was also given to the patient to review.  Patient desires Hormonal Implant, this was prescribed for patient.  ? ? ?The patient will follow up in  1 year for surveillance.  The patient was told to call with any further questions, or with any concerns about this method of contraception.  Emphasized use of condoms 100% of the time for STI prevention. ? ?Patient was not offered ECP based on current birth control method.  ? ? ?1. Family planning ?-41 year old female in clinic today for physical and STD screening.   ?-Patient is currently using the Nexplanon as her birth control method  Will return to clinic after 03/2022 for Nexplanon removal and reinsertion. ?-Patient accepted all screenings including oral, vaginal CT/GC and  bloodwork for HIV/RPR.  Bloodwork unable to be obtained today, lab staff unsuccessful at finding a vein.  Bloodwork not completed today at this visit.  ?-Patient reports increase hair loss over the last couple of months.  Advised patient to follow up with PCP regarding hair loss or specialist.  ? ?Patient meets criteria for HepB screening? No. Ordered? No - low risk  ?Patient meets criteria for HepC screening? No. Ordered? No - low risk  ? ?Treat wet prep per standing order ?Discussed time line for State Lab results and that patient will be called with positive results and encouraged patient to call if she had not heard in 2 weeks.  ?Counseled to return or seek care for continued or worsening symptoms ?Recommended condom use with all sex ? ?Patient is currently using *Nexplanon to prevent pregnancy.   ?- WET PREP FOR TRICH, YEAST, CLUE ?- Chlamydia/Gonorrhea [facility] ?- Gonococcus culture ? ?  2. Well woman exam with routine gynecological exam ?-Well woman exam performed today. ?-CBE performed today, next due 08/2022 ? ? ?3. Depression, unspecified depression type ?-Patient recently lost her son in 2020.  Since the lost of her son she admits at times it is difficult to manage that loss.  Accepts counseling services today.  Denies thoughts of self harm.   ?- Ambulatory referral to Behavioral Health  ? ? ? ?Return in about 1 year (around 08/10/2022) for Annual well-woman exam. ? ? ? ?Glenna Fellows, FNP ? ?

## 2021-08-09 NOTE — Progress Notes (Signed)
Pt here for PE and STD screening.   ?Pt had Nexplanon placed 03/2018.  She will return in October to have it removed.  ?Wet mount results reviewed,  no treatment required per SO.  Lab was unable to obtain blood for the additional tests that were ordered.  Berdie Ogren, RN ? ?

## 2021-08-14 LAB — GONOCOCCUS CULTURE

## 2022-02-20 ENCOUNTER — Encounter: Payer: Self-pay | Admitting: Nurse Practitioner

## 2022-02-20 ENCOUNTER — Ambulatory Visit (LOCAL_COMMUNITY_HEALTH_CENTER): Payer: 59 | Admitting: Nurse Practitioner

## 2022-02-20 VITALS — BP 108/77 | Ht 61.0 in | Wt 126.6 lb

## 2022-02-20 DIAGNOSIS — Z3009 Encounter for other general counseling and advice on contraception: Secondary | ICD-10-CM | POA: Diagnosis not present

## 2022-02-20 DIAGNOSIS — Z3046 Encounter for surveillance of implantable subdermal contraceptive: Secondary | ICD-10-CM

## 2022-02-20 DIAGNOSIS — Z30017 Encounter for initial prescription of implantable subdermal contraceptive: Secondary | ICD-10-CM

## 2022-02-20 MED ORDER — ETONOGESTREL 68 MG ~~LOC~~ IMPL
68.0000 mg | DRUG_IMPLANT | Freq: Once | SUBCUTANEOUS | Status: AC
  Administered 2022-02-20: 68 mg via SUBCUTANEOUS

## 2022-02-20 NOTE — Progress Notes (Signed)
Nexplanon Removal and Insertion  Patient identified, informed consent performed, consent signed.   Patient does understand that irregular bleeding is a very common side effect of this medication. She was advised to have backup contraception for one week after replacement of the implant. Patient deemed to meet WHO criteria for being reasonably certain she is not pregnant.  Appropriate time out taken. Nexplanon site identified. Area prepped in usual sterile fashon. 3 ml of 1% lidocaine with epinephrine was used to anesthetize the area at the distal end of the implant. A small stab incision was made right beside the implant on the distal portion. The Nexplanon rod was grasped using hemostats and removed without difficulty. There was minimal blood loss. There were no complications.   Confirmed correct location of insertion site. The insertion site was identified 8-10 cm (3-4 inches) from the medial epicondyle of the humerus and 3-5 cm (1.25-2 inches) posterior to (below) the sulcus (groove) between the biceps and triceps muscles of the patient's left arm. New Nexplanon removed from packaging, Device confirmed in needle, then inserted full length of needle and withdrawn per handbook instructions. Nexplanon was able to palpated in the patient's left arm; patient palpated the insert herself.  There was minimal blood loss. Patient insertion site covered with guaze and a pressure bandage to reduce any bruising. The patient tolerated the procedure well and was given post procedure instructions.   Cendy Oconnor, FNP  

## 2022-02-20 NOTE — Progress Notes (Signed)
Pt here for Nexplanon removal and reinsertion.  Berdie Ogren, RN

## 2022-09-19 ENCOUNTER — Encounter: Payer: Self-pay | Admitting: Emergency Medicine

## 2022-09-19 ENCOUNTER — Emergency Department
Admission: EM | Admit: 2022-09-19 | Discharge: 2022-09-19 | Disposition: A | Discharge status: Home / Self Care | Payer: Self-pay | Attending: Emergency Medicine | Admitting: Emergency Medicine

## 2022-09-19 ENCOUNTER — Emergency Department: Payer: Self-pay

## 2022-09-19 DIAGNOSIS — E876 Hypokalemia: Secondary | ICD-10-CM | POA: Insufficient documentation

## 2022-09-19 DIAGNOSIS — R1084 Generalized abdominal pain: Secondary | ICD-10-CM | POA: Insufficient documentation

## 2022-09-19 DIAGNOSIS — D72829 Elevated white blood cell count, unspecified: Secondary | ICD-10-CM | POA: Insufficient documentation

## 2022-09-19 DIAGNOSIS — R1031 Right lower quadrant pain: Secondary | ICD-10-CM

## 2022-09-19 LAB — CBC WITH DIFFERENTIAL/PLATELET
Abs Immature Granulocytes: 0.04 10*3/uL (ref 0.00–0.07)
Basophils Absolute: 0.1 10*3/uL (ref 0.0–0.1)
Basophils Relative: 1 %
Eosinophils Absolute: 0.1 10*3/uL (ref 0.0–0.5)
Eosinophils Relative: 1 %
HCT: 43.3 % (ref 36.0–46.0)
Hemoglobin: 14.3 g/dL (ref 12.0–15.0)
Immature Granulocytes: 0 %
Lymphocytes Relative: 32 %
Lymphs Abs: 3.5 10*3/uL (ref 0.7–4.0)
MCH: 30.7 pg (ref 26.0–34.0)
MCHC: 33 g/dL (ref 30.0–36.0)
MCV: 92.9 fL (ref 80.0–100.0)
Monocytes Absolute: 0.8 10*3/uL (ref 0.1–1.0)
Monocytes Relative: 7 %
Neutro Abs: 6.5 10*3/uL (ref 1.7–7.7)
Neutrophils Relative %: 59 %
Platelets: 305 10*3/uL (ref 150–400)
RBC: 4.66 MIL/uL (ref 3.87–5.11)
RDW: 12.7 % (ref 11.5–15.5)
WBC: 10.9 10*3/uL — ABNORMAL HIGH (ref 4.0–10.5)
nRBC: 0 % (ref 0.0–0.2)

## 2022-09-19 LAB — COMPREHENSIVE METABOLIC PANEL
ALT: 16 U/L (ref 0–44)
AST: 20 U/L (ref 15–41)
Albumin: 4.2 g/dL (ref 3.5–5.0)
Alkaline Phosphatase: 44 U/L (ref 38–126)
Anion gap: 7 (ref 5–15)
BUN: 14 mg/dL (ref 6–20)
CO2: 20 mmol/L — ABNORMAL LOW (ref 22–32)
Calcium: 8.6 mg/dL — ABNORMAL LOW (ref 8.9–10.3)
Chloride: 106 mmol/L (ref 98–111)
Creatinine, Ser: 0.72 mg/dL (ref 0.44–1.00)
GFR, Estimated: >60 mL/min (ref >60)
Glucose, Bld: 92 mg/dL (ref 70–99)
Potassium: 3.2 mmol/L — ABNORMAL LOW (ref 3.5–5.1)
Sodium: 133 mmol/L — ABNORMAL LOW (ref 135–145)
Total Bilirubin: 0.7 mg/dL (ref 0.3–1.2)
Total Protein: 7.6 g/dL (ref 6.5–8.1)

## 2022-09-19 LAB — URINALYSIS, ROUTINE W REFLEX MICROSCOPIC
Bilirubin Urine: NEGATIVE
Glucose, UA: NEGATIVE mg/dL
Hgb urine dipstick: NEGATIVE
Ketones, ur: NEGATIVE mg/dL
Nitrite: NEGATIVE
Protein, ur: NEGATIVE mg/dL
Specific Gravity, Urine: 1.023 (ref 1.005–1.030)
pH: 5 (ref 5.0–8.0)

## 2022-09-19 LAB — LIPASE, BLOOD: Lipase: 44 U/L (ref 11–51)

## 2022-09-19 LAB — POC URINE PREG, ED: Preg Test, Ur: NEGATIVE

## 2022-09-19 MED ORDER — KETOROLAC TROMETHAMINE 30 MG/ML IJ SOLN
15.0000 mg | Freq: Once | INTRAMUSCULAR | Status: AC
  Administered 2022-09-19: 15 mg via INTRAVENOUS
  Filled 2022-09-19: qty 1

## 2022-09-19 MED ORDER — LACTATED RINGERS IV BOLUS
1000.0000 mL | Freq: Once | INTRAVENOUS | Status: AC
  Administered 2022-09-19: 1000 mL via INTRAVENOUS

## 2022-09-19 MED ORDER — IOHEXOL 300 MG/ML  SOLN
100.0000 mL | Freq: Once | INTRAMUSCULAR | Status: AC | PRN
  Administered 2022-09-19: 100 mL via INTRAVENOUS

## 2022-09-19 NOTE — ED Provider Notes (Signed)
[facility] Provider Note    Event Date/Time   First MD Initiated Contact with Patient 09/19/22 0321     (approximate)   History   Abdominal Pain   HPI  Bethany Stephens is a 42 y.o. female who presents to the ED for evaluation of Abdominal Pain   Patient self reports a hx of IBS on Colestipol , but she reports noncompliance with this medication over the past few days. Reports 1-2 days of RLQ pain, sharp, worse with movements. No N/v/D. No urinary symptoms.    Physical Exam   Triage Vital Signs: ED Triage Vitals  Enc Vitals Group     BP 09/19/22 0242 (!) 132/94     Pulse Rate 09/19/22 0242 83     Resp 09/19/22 0242 18     Temp 09/19/22 0242 98.2 F (36.8 C)     Temp Source 09/19/22 0242 Oral     SpO2 09/19/22 0242 100 %     Weight 09/19/22 0241 127 lb (57.6 kg)     Height 09/19/22 0241  (1.549 m)     Head Circumference --      Peak Flow --      Pain Score 09/19/22 0241 5     Pain Loc --      Pain Edu? --      Excl. in GC? --     Most recent vital signs: Vitals:   09/19/22 0242 09/19/22 0521  BP: (!) 132/94 128/89  Pulse: 83 76  Resp: 18   Temp: 98.2 F (36.8 C)   SpO2: 100% 99%    General: Awake, no distress.  CV:  Good peripheral perfusion.  Resp:  Normal effort.  Abd:  No distention. Diffuse and mild TTP, somewhat more prominent to the RLQ, no peritoneal features.  MSK:  No deformity noted.  Neuro:  No focal deficits appreciated. Other:     ED Results / Procedures / Treatments   Labs (all labs ordered are listed, but only abnormal results are displayed) Labs Reviewed  CBC WITH DIFFERENTIAL/PLATELET - Abnormal; Notable for the following components:      Result Value   WBC 10.9 (*)    All other components within normal limits  COMPREHENSIVE METABOLIC PANEL - Abnormal; Notable for the following components:   Sodium 133 (*)    Potassium 3.2 (*)    CO2 20 (*)    Calcium 8.6 (*)    All other components within normal  limits  URINALYSIS, ROUTINE W REFLEX MICROSCOPIC - Abnormal; Notable for the following components:   Color, Urine YELLOW (*)    APPearance CLOUDY (*)    Leukocytes,Ua TRACE (*)    Bacteria, UA RARE (*)    All other components within normal limits  LIPASE, BLOOD  POC URINE PREG, ED    EKG   RADIOLOGY CT abdomen/pelvis interpreted by me without appendicitis  Official radiology report(s): CT ABDOMEN PELVIS W CONTRAST  Result Date: 09/19/2022 CLINICAL DATA:  Right lower quadrant pain, evaluate for appendicitis. EXAM: CT ABDOMEN AND PELVIS WITH CONTRAST TECHNIQUE: Multidetector CT imaging of the abdomen and pelvis was performed using the standard protocol following bolus administration of intravenous contrast. RADIATION DOSE REDUCTION: This exam was performed according to the departmental dose-optimization program which includes automated exposure control, adjustment of the mA and/or kV according to patient size and/or use of iterative reconstruction technique. CONTRAST:  OMNIPAQUE IOHEXOL 300 MG/ML  SOLN COMPARISON:  08/17/2020 FINDINGS: Lower chest:  No contributory  findings. Hepatobiliary: No focal liver abnormality.No evidence of biliary obstruction or stone. Pancreas: Unremarkable. Spleen: Unremarkable. Adrenals/Urinary Tract: Negative adrenals. No hydronephrosis or ureteral stone. 3 mm lower pole calculus superiorly on the right. Malrotated left kidney. Unremarkable bladder. Stomach/Bowel: Trace fat stranding and peritoneal fluid along the right pericolic gutter. There are some proximal colonic diverticula but none appear focally inflamed. Submucosal fat density in the ascending colon with stable appearance. Vascular/Lymphatic: No acute vascular abnormality. No mass or adenopathy. Reproductive:No pathologic findings. Other: No ascites or pneumoperitoneum. Musculoskeletal: No acute abnormalities. IMPRESSION: 1. Minimal inflammatory changes along the ascending colon, question epiploic  appendagitis. There are ascending colonic diverticula but none appear focally inflamed. Early diverticulitis is not excluded. 2. Normal appendix. 3. Small right renal calculus. Electronically Signed   By: Tiburcio Pea M.D.   On: 09/19/2022 05:34    PROCEDURES and INTERVENTIONS:  Procedures  Medications  ketorolac (TORADOL) 30 MG/ML injection 15 mg (15 mg Intravenous Given 09/19/22 0420)  lactated ringers bolus 1,000 mL (0 mLs Intravenous Stopped 09/19/22 0616)  iohexol (OMNIPAQUE) 300 MG/ML solution 100 mL (100 mLs Intravenous Contrast Given 09/19/22 0513)     IMPRESSION / MDM / ASSESSMENT AND PLAN / ED COURSE  I reviewed the triage vital signs and the nursing notes.  Differential diagnosis includes, but is not limited to, IBS, cystitis, appendicitis, diverticulitis  {Patient presents with symptoms of an acute illness or injury that is potentially life-threatening.  42 year old woman with IBS presents with RLQ abdominal pain possibly related to IBS versus epiploic appendagitis and suitable for outpatient management.  No peritoneal features.  Blood work with marginal leukocytosis and hypokalemia.  Normal lipase.  Urine with squamous cells and no infectious features.  CT without appendicitis and question some mild epiploic appendagitis.  She is feeling better with NSAIDs.  We discussed a few days of NSAIDs and I recommended she get back on her IBS medications.  We discussed return precautions.  Clinical Course as of 09/19/22 0643  Fri Sep 19, 2022  0544 Reassessed.  Feels well.  Reports improved pain after the Toradol.  We discussed CT results that are reassuring without evidence of appendicitis, questionable epiploic appendagitis and I recommended NSAIDs for a few days on top of her colestipol [DS]    Clinical Course User Index [DS] Delton Prairie, MD     FINAL CLINICAL IMPRESSION(S) / ED DIAGNOSES   Final diagnoses:  RLQ abdominal pain     Rx / DC Orders   ED Discharge Orders      None        Note:  This document was prepared using Dragon voice recognition software and may include unintentional dictation errors.   Delton Prairie, MD 09/19/22 [phone]

## 2022-09-19 NOTE — ED Triage Notes (Signed)
Pt presents ambulatory to triage via POV with complaints of RLQ pain for the last 24 hours.Rates the pain 5/10 - described as "waves of being squeezed". Hx of cholecystectomy. A&Ox4 at this time. Denies urinary sx, N/V/D, chills, cough, CP or SOB.

## 2022-09-19 NOTE — Discharge Instructions (Signed)
Please take Tylenol and ibuprofen/Advil for your pain.  It is safe to take them together, or to alternate them every few hours.  Take up to 1000mg of Tylenol at a time, up to 4 times per day.  Do not take more than 4000 mg of Tylenol in 24 hours.  For ibuprofen, take 400-600 mg, 3 - 4 times per day.  

## 2022-09-19 NOTE — ED Notes (Signed)
Lab contacted to verify receiving the patients lab - they verified the specimens and are processing them at this time.

## 2023-05-19 DIAGNOSIS — R933 Abnormal findings on diagnostic imaging of other parts of digestive tract: Secondary | ICD-10-CM | POA: Diagnosis not present

## 2023-05-19 DIAGNOSIS — K580 Irritable bowel syndrome with diarrhea: Secondary | ICD-10-CM | POA: Diagnosis not present

## 2023-05-19 DIAGNOSIS — K529 Noninfective gastroenteritis and colitis, unspecified: Secondary | ICD-10-CM | POA: Diagnosis not present

## 2023-06-16 DIAGNOSIS — R933 Abnormal findings on diagnostic imaging of other parts of digestive tract: Secondary | ICD-10-CM | POA: Diagnosis not present

## 2023-06-16 DIAGNOSIS — D123 Benign neoplasm of transverse colon: Secondary | ICD-10-CM | POA: Diagnosis not present

## 2023-06-16 DIAGNOSIS — K5730 Diverticulosis of large intestine without perforation or abscess without bleeding: Secondary | ICD-10-CM | POA: Diagnosis not present

## 2023-06-16 DIAGNOSIS — K635 Polyp of colon: Secondary | ICD-10-CM | POA: Diagnosis not present

## 2023-08-21 ENCOUNTER — Other Ambulatory Visit (HOSPITAL_COMMUNITY): Payer: Self-pay

## 2023-08-21 MED ORDER — COLESTIPOL HCL 1 G PO TABS
2.0000 g | ORAL_TABLET | Freq: Every evening | ORAL | 3 refills | Status: AC
  Filled 2023-08-21: qty 180, 90d supply, fill #0
  Filled 2023-12-03: qty 180, 90d supply, fill #1

## 2023-10-20 DIAGNOSIS — F411 Generalized anxiety disorder: Secondary | ICD-10-CM | POA: Diagnosis not present

## 2023-10-20 DIAGNOSIS — Z13220 Encounter for screening for lipoid disorders: Secondary | ICD-10-CM | POA: Diagnosis not present

## 2023-10-20 DIAGNOSIS — Z131 Encounter for screening for diabetes mellitus: Secondary | ICD-10-CM | POA: Diagnosis not present

## 2023-10-20 DIAGNOSIS — F5101 Primary insomnia: Secondary | ICD-10-CM | POA: Diagnosis not present

## 2023-10-20 DIAGNOSIS — K580 Irritable bowel syndrome with diarrhea: Secondary | ICD-10-CM | POA: Diagnosis not present

## 2023-10-20 DIAGNOSIS — Z1231 Encounter for screening mammogram for malignant neoplasm of breast: Secondary | ICD-10-CM | POA: Diagnosis not present

## 2023-10-20 DIAGNOSIS — Z1159 Encounter for screening for other viral diseases: Secondary | ICD-10-CM | POA: Diagnosis not present

## 2023-10-20 DIAGNOSIS — Z8719 Personal history of other diseases of the digestive system: Secondary | ICD-10-CM | POA: Diagnosis not present

## 2023-10-30 ENCOUNTER — Other Ambulatory Visit (HOSPITAL_COMMUNITY): Payer: Self-pay

## 2023-10-30 DIAGNOSIS — Z1231 Encounter for screening mammogram for malignant neoplasm of breast: Secondary | ICD-10-CM | POA: Diagnosis not present

## 2023-11-03 ENCOUNTER — Other Ambulatory Visit (HOSPITAL_COMMUNITY): Payer: Self-pay

## 2023-11-03 DIAGNOSIS — E782 Mixed hyperlipidemia: Secondary | ICD-10-CM | POA: Diagnosis not present

## 2023-11-03 DIAGNOSIS — Z0000 Encounter for general adult medical examination without abnormal findings: Secondary | ICD-10-CM | POA: Diagnosis not present

## 2023-11-03 MED ORDER — HYDROXYZINE HCL 25 MG PO TABS
25.0000 mg | ORAL_TABLET | Freq: Three times a day (TID) | ORAL | 3 refills | Status: AC | PRN
  Filled 2023-11-03: qty 45, 15d supply, fill #0

## 2023-11-03 MED ORDER — HYDROXYZINE HCL 25 MG PO TABS
25.0000 mg | ORAL_TABLET | Freq: Three times a day (TID) | ORAL | 0 refills | Status: AC | PRN
  Filled 2023-11-03: qty 45, 15d supply, fill #0

## 2023-11-17 DIAGNOSIS — R92332 Mammographic heterogeneous density, left breast: Secondary | ICD-10-CM | POA: Diagnosis not present

## 2023-11-17 DIAGNOSIS — N6321 Unspecified lump in the left breast, upper outer quadrant: Secondary | ICD-10-CM | POA: Diagnosis not present

## 2023-11-17 DIAGNOSIS — R922 Inconclusive mammogram: Secondary | ICD-10-CM | POA: Diagnosis not present

## 2023-11-30 ENCOUNTER — Other Ambulatory Visit (HOSPITAL_COMMUNITY): Payer: Self-pay

## 2023-11-30 DIAGNOSIS — H109 Unspecified conjunctivitis: Secondary | ICD-10-CM | POA: Diagnosis not present

## 2023-11-30 MED ORDER — TOBRAMYCIN 0.3 % OP SOLN
1.0000 [drp] | OPHTHALMIC | 0 refills | Status: AC
  Filled 2023-11-30: qty 5, 8d supply, fill #0

## 2023-12-03 ENCOUNTER — Other Ambulatory Visit (HOSPITAL_COMMUNITY): Payer: Self-pay

## 2023-12-21 ENCOUNTER — Other Ambulatory Visit (HOSPITAL_COMMUNITY): Payer: Self-pay

## 2023-12-21 MED ORDER — FLUCONAZOLE 150 MG PO TABS
150.0000 mg | ORAL_TABLET | ORAL | 0 refills | Status: AC
  Filled 2023-12-21: qty 2, 3d supply, fill #0

## 2023-12-30 ENCOUNTER — Other Ambulatory Visit (HOSPITAL_COMMUNITY): Payer: Self-pay

## 2024-02-01 ENCOUNTER — Other Ambulatory Visit (HOSPITAL_COMMUNITY): Payer: Self-pay

## 2024-02-01 MED ORDER — HYDROXYZINE HCL 25 MG PO TABS
25.0000 mg | ORAL_TABLET | Freq: Three times a day (TID) | ORAL | 3 refills | Status: AC | PRN
  Filled 2024-02-01: qty 90, 30d supply, fill #0

## 2024-03-13 ENCOUNTER — Encounter (HOSPITAL_COMMUNITY): Payer: Self-pay

## 2024-03-13 ENCOUNTER — Other Ambulatory Visit: Payer: Self-pay

## 2024-03-13 ENCOUNTER — Emergency Department (HOSPITAL_COMMUNITY)
Admission: EM | Admit: 2024-03-13 | Discharge: 2024-03-13 | Disposition: A | Discharge status: Home / Self Care | Payer: Self-pay | Attending: Emergency Medicine | Admitting: Emergency Medicine

## 2024-03-13 DIAGNOSIS — F17290 Nicotine dependence, other tobacco product, uncomplicated: Secondary | ICD-10-CM | POA: Insufficient documentation

## 2024-03-13 DIAGNOSIS — R197 Diarrhea, unspecified: Secondary | ICD-10-CM | POA: Insufficient documentation

## 2024-03-13 DIAGNOSIS — R112 Nausea with vomiting, unspecified: Secondary | ICD-10-CM | POA: Insufficient documentation

## 2024-03-13 LAB — COMPREHENSIVE METABOLIC PANEL WITH GFR
ALT: 16 U/L (ref 0–44)
AST: 26 U/L (ref 15–41)
Albumin: 4.3 g/dL (ref 3.5–5.0)
Alkaline Phosphatase: 42 U/L (ref 38–126)
Anion gap: 15 (ref 5–15)
BUN: 13 mg/dL (ref 6–20)
CO2: 18 mmol/L — ABNORMAL LOW (ref 22–32)
Calcium: 9.7 mg/dL (ref 8.9–10.3)
Chloride: 107 mmol/L (ref 98–111)
Creatinine, Ser: 0.65 mg/dL (ref 0.44–1.00)
GFR, Estimated: >60 mL/min (ref >60)
Glucose, Bld: 102 mg/dL — ABNORMAL HIGH (ref 70–99)
Potassium: 3.8 mmol/L (ref 3.5–5.1)
Sodium: 140 mmol/L (ref 135–145)
Total Bilirubin: 0.4 mg/dL (ref 0.0–1.2)
Total Protein: 7.4 g/dL (ref 6.5–8.1)

## 2024-03-13 LAB — CBC
HCT: 42.9 % (ref 36.0–46.0)
Hemoglobin: 14.1 g/dL (ref 12.0–15.0)
MCH: 30.5 pg (ref 26.0–34.0)
MCHC: 32.9 g/dL (ref 30.0–36.0)
MCV: 92.7 fL (ref 80.0–100.0)
Platelets: 300 K/uL (ref 150–400)
RBC: 4.63 MIL/uL (ref 3.87–5.11)
RDW: 12.8 % (ref 11.5–15.5)
WBC: 18 K/uL — ABNORMAL HIGH (ref 4.0–10.5)
nRBC: 0 % (ref 0.0–0.2)

## 2024-03-13 LAB — HCG, SERUM, QUALITATIVE: Preg, Serum: NEGATIVE

## 2024-03-13 LAB — LIPASE, BLOOD: Lipase: 43 U/L (ref 11–51)

## 2024-03-13 MED ORDER — LOPERAMIDE HCL 2 MG PO CAPS: 2.0000 mg | ORAL_CAPSULE | Freq: Four times a day (QID) | ORAL | 0 refills | Status: AC | PRN

## 2024-03-13 MED ORDER — ONDANSETRON 4 MG PO TBDP: 4.0000 mg | ORAL_TABLET | Freq: Three times a day (TID) | ORAL | 0 refills | Status: AC | PRN

## 2024-03-13 MED ORDER — DICYCLOMINE HCL 20 MG PO TABS: 20.0000 mg | ORAL_TABLET | Freq: Two times a day (BID) | ORAL | 0 refills | Status: AC

## 2024-03-13 MED ORDER — SODIUM CHLORIDE 0.9 % IV BOLUS
1000.0000 mL | Freq: Once | INTRAVENOUS | Status: AC
  Administered 2024-03-13: 1000 mL via INTRAVENOUS

## 2024-03-13 MED ORDER — FAMOTIDINE IN NACL 20-0.9 MG/50ML-% IV SOLN
20.0000 mg | Freq: Once | INTRAVENOUS | Status: AC
  Administered 2024-03-13: 20 mg via INTRAVENOUS
  Filled 2024-03-13: qty 50

## 2024-03-13 MED ORDER — KETOROLAC TROMETHAMINE 15 MG/ML IJ SOLN
15.0000 mg | Freq: Once | INTRAMUSCULAR | Status: AC
  Administered 2024-03-13: 15 mg via INTRAVENOUS
  Filled 2024-03-13: qty 1

## 2024-03-13 NOTE — ED Triage Notes (Signed)
 Pt BIB [facility] from home for N/V/D and intermittent abd pain x1 hour after consuming homemade seafood boil at home at around 0030. She reports her husband also consumed it, but feels okay.  EMS 200 mL NS 20 LAC 118/60 87 NSR 110 RA 134 CBG

## 2024-03-13 NOTE — Discharge Instructions (Addendum)
 You were evaluated in the Emergency Department and after careful evaluation, we did not find any emergent condition requiring admission or further testing in the hospital.  Your exam/testing today is overall reassuring.  Symptoms likely due to a stomach bug or gastroenteritis.  Likely due to a virus.  Use Zofran as needed for nausea, use the Imodium as needed for diarrhea, use the Bentyl as needed for crampy abdominal pain.  Plenty of fluids and rest.  Please return to the Emergency Department if you experience any worsening of your condition.   Thank you for allowing us  to be a part of your care.

## 2024-03-13 NOTE — ED Provider Notes (Signed)
 [facility]EMERGENC[facility] DEPT [facility] Emergency Department Provider Note MRN:  [id number]  Arrival date & time: 03/13/24     Chief Complaint   Abdominal Pain, Emesis, and Diarrhea   History of Present Illness   Bethany Stephens is a 43 y.o. year-old female with no pertinent medical presenting to the ED with chief complaint of nausea vomiting diarrhea.  About an hour after eating crab legs and muscles patient began having nausea vomiting and diarrhea.  Some abdominal cramping as well.  Review of Systems  A thorough review of systems was obtained and all systems are negative except as noted in the HPI and PMH.   Patient's Health History    Past Medical History:  Diagnosis Date   Diverticulosis    IBS (irritable bowel syndrome)    Mental disorder     Past Surgical History:  Procedure Laterality Date   CHOLECYSTECTOMY  2008    Family History  Problem Relation Age of Onset   Hypertension Mother    Diabetes Mother    Hypothyroidism Mother    Liver cancer Father    Hypertension Maternal Grandmother    Heart disease Maternal Grandmother    Diabetes Maternal Grandmother    Depression Daughter    Colon cancer Maternal Aunt    Depression Maternal Aunt    Schizophrenia Maternal Aunt    Bipolar disorder Maternal Aunt    Bipolar disorder Maternal Uncle    Depression Maternal Uncle    Kidney disease Maternal Uncle    Sarcoidosis Cousin    Sarcoidosis Cousin        lung    Social History   Socioeconomic History   Marital status: Single    Spouse name: Not on file   Number of children: 3   Years of education: 16   Highest education level: Not on file  Occupational History   Not on file  Tobacco Use   Smoking status: Some Days    Types: E-cigarettes   Smokeless tobacco: Never   Tobacco comments:    Ocassional Vapes  Vaping Use   Vaping status: Some Days   Substances: Nicotine, Flavoring  Substance and Sexual Activity   Alcohol use: Yes    Alcohol/week: 1.0 standard  drink of alcohol    Types: 1 Glasses of wine per week    Comment: 1-2x/month   Drug use: Not Currently    Types: Marijuana, MDMA (Ecstacy)   Sexual activity: Yes    Partners: Male    Birth control/protection: Implant  Other Topics Concern   Not on file  Social History Narrative   Not on file   Social Drivers of Health   Financial Resource Strain: Not on file  Food Insecurity: Not on file  Transportation Needs: Not on file  Physical Activity: Not on file  Stress: Not on file  Social Connections: Not on file  Intimate Partner Violence: Not At Risk (02/20/2022)   Humiliation, Afraid, Rape, and Kick questionnaire    Fear of Current or Ex-Partner: No    Emotionally Abused: No    Physically Abused: No    Sexually Abused: No     Physical Exam   Vitals:   03/13/24 0156 03/13/24 0158  BP: 111/75   Pulse:  82  SpO2:  100%    CONSTITUTIONAL: Well-appearing, NAD NEURO/PSYCH:  Alert and oriented x 3, no focal deficits EYES:  eyes equal and reactive ENT/NECK:  no LAD, no JVD CARDIO: Regular rate, well-perfused, normal S1 and S2 PULM:  CTAB no  wheezing or rhonchi GI/GU:  non-distended, non-tender MSK/SPINE:  No gross deformities, no edema SKIN:  no rash, atraumatic   *Additional and/or pertinent findings included in MDM below  Diagnostic and Interventional Summary    EKG Interpretation Date/Time:  Sunday March 13 2024 02:22:55 EDT Ventricular Rate:  91 PR Interval:  174 QRS Duration:  78 QT Interval:  373 QTC Calculation: 459 R Axis:   51  Text Interpretation: Sinus rhythm Confirmed by Avary Pitsenbarger ([id number]) on 03/13/2024 3:47:48 AM       Labs Reviewed  COMPREHENSIVE METABOLIC PANEL WITH GFR - Abnormal; Notable for the following components:      Result Value   CO2 18 (*)    Glucose, Bld 102 (*)    All other components within normal limits  CBC - Abnormal; Notable for the following components:   WBC 18.0 (*)    All other components within normal limits   LIPASE, BLOOD  HCG, SERUM, QUALITATIVE    No orders to display    Medications  sodium chloride 0.9 % bolus 1,000 mL (1,000 mLs Intravenous New Bag/Given 03/13/24 0256)  famotidine (PEPCID) IVPB 20 mg premix (20 mg Intravenous New Bag/Given 03/13/24 0300)  ketorolac (TORADOL) 15 MG/ML injection 15 mg (15 mg Intravenous Given 03/13/24 0259)     Procedures  /  Critical Care Procedures  ED Course and Medical Decision Making  Initial Impression and Ddx Well-appearing in no acute distress, reassuring vital signs, abdomen soft and nontender.  Favoring gastroenteritis or food poisoning.  She states she has a history of ileus, doubt ileus or SBO given reassuring exam.  Providing fluids, screening labs, will reassess.  Past medical/surgical history that increases complexity of ED encounter: None  Interpretation of Diagnostics I personally reviewed the Laboratory Testing and my interpretation is as follows: No significant blood count or electrolyte disturbance.    Patient Reassessment and Ultimate Disposition/Management     Patient feeling a lot better, reassuring abdominal exam, vitals, no signs of emergent process, no indication for further testing at this time.  Appropriate for discharge.  Patient management required discussion with the following services or consulting groups:  None  Complexity of Problems Addressed Acute illness or injury that poses threat of life of bodily function  Additional Data Reviewed and Analyzed Further history obtained from: Further history from spouse/family member  Additional Factors Impacting ED Encounter Risk Prescriptions  Yobana Culliton M. Daleah Coulson, MD [place] [facility] [email or web address]  Final Clinical Impressions(s) / ED Diagnoses     ICD-10-CM   1. Nausea vomiting and diarrhea  R11.2    R19.7       ED Discharge Orders          Ordered    dicyclomine (BENTYL) 20 MG tablet  2 times daily         03/13/24 0404    ondansetron (ZOFRAN-ODT) 4 MG disintegrating tablet  Every 8 hours PRN        03/13/24 0404    loperamide (IMODIUM) 2 MG capsule  4 times daily PRN        10 /05/25 0404             Discharge Instructions Discussed with and Provided to Patient:     Discharge Instructions      You were evaluated in the Emergency Department and after careful evaluation, we did not find any emergent condition requiring admission or further testing in the hospital.  Your exam/testing today is overall reassuring.  Symptoms  likely due to a stomach bug or gastroenteritis.  Likely due to a virus.  Use Zofran as needed for nausea, use the Imodium as needed for diarrhea, use the Bentyl as needed for crampy abdominal pain.  Plenty of fluids and rest.  Please return to the Emergency Department if you experience any worsening of your condition.   Thank you for allowing us  to be a part of your care.       Theadore Ozell HERO, MD 03/13/24 [phone]

## 2024-03-13 NOTE — ED Notes (Signed)
 Urine sample sent to lab
